# Patient Record
Sex: Female | Born: 1995 | Race: White | Hispanic: No | Marital: Single | Smoking: Former smoker
Health system: Southern US, Community
[De-identification: ages and names within clinical notes are randomized; demographics above are authoritative.]

## PROBLEM LIST (undated history)

## (undated) DIAGNOSIS — F419 Anxiety disorder, unspecified: Secondary | ICD-10-CM

## (undated) HISTORY — DX: Anxiety disorder, unspecified: F41.9

---
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
## 2013-01-13 ENCOUNTER — Ambulatory Visit (INDEPENDENT_AMBULATORY_CARE_PROVIDER_SITE_OTHER): Payer: BC Managed Care – PPO | Admitting: Marriage and Family Therapist

## 2013-01-13 DIAGNOSIS — F411 Generalized anxiety disorder: Secondary | ICD-10-CM

## 2013-01-13 DIAGNOSIS — F331 Major depressive disorder, recurrent, moderate: Secondary | ICD-10-CM

## 2013-01-13 NOTE — Progress Notes (Signed)
   THERAPIST PROGRESS NOTE  Session Time:   Noon - 1 p.m.  Participation Level: Active  Behavioral Response: CasualAlertDepressed (mild)  Type of Therapy: Individual Therapy  Treatment Goals addressed: Coping  Interventions: Strength-based and Supportive  Summary: Angel Oneal is a 17 y.o. female who presents with depression and anxiety. She was referred by Dr. Lucianne Muss.   Patient reports she is doing better than she was doing in her last session.  She reports she is finished with this semester in college with the exception of two exams.  She also reported she has three friends from her philosophy class and has been studying with them all as well as studying with her friend from high school.  Patient reports she has "always studied with others.  She reports she is looking forward to having time off and wants "down time" from others.  She reports her mother has told her she needs down time based on how patient has been, although patient did not elaborate on this statement.  She also talked about her birthday coming up and how she is looking forward to her birthday.  Patient did however report she smoked marijuana with her father.  She reports it "was a bonding experience."  Suicidal/Homicidal: NA  Therapist Response:   Assessed for depression and anxiety and patient reports minimal symptoms.  She reports using self-talk to "talk herself out of having a meltdown" which she was able to do for the past two weeks.  Patient was able to identify getting out of school for a month was a factor in the decrease in her symptoms.  Talked about patient developing friendships at school which is a difficult task for her, and has lead to the development of patient's depression i.e., isolation from others.  Plan: Return again in 2 weeks.  Diagnosis: Axis I: Major depressive d/o, mild; GAD    Axis II: Deferred    Eulamae Greenstein, LMFT, CTS 01/13/2013

## 2013-01-26 ENCOUNTER — Ambulatory Visit (INDEPENDENT_AMBULATORY_CARE_PROVIDER_SITE_OTHER): Payer: BC Managed Care – PPO | Admitting: Marriage and Family Therapist

## 2013-01-26 DIAGNOSIS — F331 Major depressive disorder, recurrent, moderate: Secondary | ICD-10-CM

## 2013-01-26 DIAGNOSIS — F411 Generalized anxiety disorder: Secondary | ICD-10-CM

## 2013-01-26 NOTE — Progress Notes (Signed)
   THERAPIST PROGRESS NOTE  Session Time:  Noon - 1 p.m.  Participation Level: Active  Behavioral Response: CasualAlertDepression (mild)  Type of Therapy: Individual Therapy  Treatment Goals addressed: Coping  Interventions: Strength-based and Supportive  Summary: Angel Oneal is a 17 y.o. female who presents with depression, anxiety.  She was referred by Dr. Lucianne Muss.  Patient states she has been doing well because she is out of school.  She did report "missing the academics" and that she did very well with the exception of receiving a "C" in biology.  Patient states she has been knitting making Christmas presents and actually sold some on [employer].  She reports knitting is "very therapeutic."  She did say she has not taken her antidepressant for about two weeks, but took herself off of it by weening.  She says she feels "good."  She did say she is smoking marijuana but is "trying to be aware that she is not smoking more because she is off of her antidepressant."  Patient states she has not seen Dr. Lucianne Muss but believes she has an appointment coming up with her.  Suicidal/Homicidal: NA  Therapist Response:  Talked about patient taking herself off of her antidepressant.  Discussed any possible problems and reminded her that when she starts school again she could have a problem with depression without her medication.  Talked about patient "needing to try to go without them."  Patient states it is because she wants to use her skills she learned in therapy rather than the medication.  Assessed patient's use of marijuana to help her recognize that she may or should be careful regarding using marijuana for self-medicating.  Patient said she would.  Reviewed patient's treatment plan.  Patient talked about where she is doing well.  Patient did say her self-esteem has improved compared to when she started therapy however she still struggles with feeling good about herself.  She rated how bad her self-esteem  was when she started therapy a "10" on a 0-10 scale but reports it has been "about a 6-7 and times lower."  She admits her ability to open up to others still is a problem but has "improved significantly."  She reports using self-talk and identifying cognitive distortions and she regularly uses meditation to help with depression and anxiety.  Will continue with treatment plan goals to enhance what patient has already learned.  Plan: Return again in 2 weeks.  Diagnosis: Axis I: Major depressive d/o, moderate; GAD    Axis II: Deferred    Klinton Candelas, LMFT, CTS 01/26/2013

## 2013-01-27 ENCOUNTER — Ambulatory Visit (HOSPITAL_COMMUNITY): Payer: Self-pay | Admitting: Marriage and Family Therapist

## 2013-02-10 ENCOUNTER — Ambulatory Visit (HOSPITAL_COMMUNITY): Payer: Self-pay | Admitting: Marriage and Family Therapist

## 2013-03-03 ENCOUNTER — Ambulatory Visit (INDEPENDENT_AMBULATORY_CARE_PROVIDER_SITE_OTHER): Payer: BC Managed Care – PPO | Admitting: Marriage and Family Therapist

## 2013-03-03 DIAGNOSIS — F411 Generalized anxiety disorder: Secondary | ICD-10-CM

## 2013-03-03 DIAGNOSIS — F331 Major depressive disorder, recurrent, moderate: Secondary | ICD-10-CM

## 2013-03-03 NOTE — Progress Notes (Signed)
   THERAPIST PROGRESS NOTE  Session Time:  Noon - 1 p.m.  Participation Level: Active  Behavioral Response: CasualAlertDepressed  Type of Therapy: Individual Therapy  Treatment Goals addressed: Coping  Interventions: Strength-based and Supportive  Summary: Angel Oneal is a 18 y.o. female who presents with depression and anxiety.  She was referred by Dr. Lucianne MussKumar.   Patient reports she has been having trouble figuring out how she is feeling.  She states believing she is "not feeling much of anything."  Patient states she has been in her room a lot which may have to do with it e.g., not having much contact with others.  She reports "taking herself off of her depression medication" and maybe this is why she is feeling "flat" (her word).  Patient talked about how school is going and that she still has no major.  Overall, she states "not much has been going on."  Suicidal/Homicidal: No  Therapist Response: Assessed patient for depression.  Patient appears to have some mild depression based on not having much direction (discussed with patient and she agreed).  Discussed patient wanting to be a pre-med major. She reports wanting to be a doctor ever since "[employer] has been on, particularly seeing one doctor as her hero.  Discussed and processed with patient why she has not moved forward to claim this major and patient states she is worried she will disappoint someone because she could not be available to them (e.g., friends) if she is so busy.  Asked patient to think into the future 20 years from now and identify whether or not she would have regret over not becoming a doctor and patient stated, "yes, I hadn't thought of that."  Discussed patient allowing herself to be "uncomfortable" relating to deciding not to be a doctor for other and allowing herself to go through with what she needs to do to make that happen.  Will continue to discuss same since this topic has to do with patient's  self-esteem problems.  Plan: Return again in 2 weeks.  Diagnosis: Axis I: Major depressive d/o, moderate; GAD    Axis II: Deferred    Caria Transue, LMFT, CTS 03/03/2013

## 2013-03-17 ENCOUNTER — Ambulatory Visit (INDEPENDENT_AMBULATORY_CARE_PROVIDER_SITE_OTHER): Payer: BC Managed Care – PPO | Admitting: Marriage and Family Therapist

## 2013-03-17 DIAGNOSIS — F331 Major depressive disorder, recurrent, moderate: Secondary | ICD-10-CM

## 2013-03-17 DIAGNOSIS — F411 Generalized anxiety disorder: Secondary | ICD-10-CM

## 2013-03-17 NOTE — Progress Notes (Signed)
   THERAPIST PROGRESS NOTE  Session Time:   Noon - 1 p.m.  Participation Level: Active  Behavioral Response: CasualAlertAnxious (mild)  Type of Therapy: Individual Therapy  Treatment Goals addressed: Coping  Interventions: Strength-based and Supportive  Summary: Angel Oneal is a 18 y.o. female who presents with depression and anxiety.  She was referred by Dr. Lucianne MussKumar.   Patient states she has been doing very well this past two weeks.  She stated she has been busy because she decided to declare a pre-med major.  She states she has already set up this major, is taking a chemistry class in the Summer, and signed up for a phlebotomy class.  She also states she has been thinking about her relationship with her boyfriend "not providing what I need" and talked to him about how she is feeling.  She reports he has dropped out of school, is working as a Public affairs consultantdishwasher, and is "smoking marijuana all the time."  Patient states she thinks about him living this sort of life while she goes on with her life.  Suicidal/Homicidal: NA  Therapist Response:   Assessed patient's symptoms of depression and anxiety.  No depression reported and her anxiety is focused on her decisions she has made over the past week coinciding with fear and feeling excited about her future.  Talked about these feeling being normal because she has made a big decision about college.  Discussed at length how to express her needs in relationships.  Plan: Return again in 2 weeks.  Diagnosis: Axis I: Major depressive d/o, moderate; GAD    Axis II: Deferred    Mercy Leppla, LMFT, CTS 03/17/2013

## 2013-03-31 ENCOUNTER — Ambulatory Visit (HOSPITAL_COMMUNITY): Payer: Self-pay | Admitting: Marriage and Family Therapist

## 2013-04-02 ENCOUNTER — Ambulatory Visit (INDEPENDENT_AMBULATORY_CARE_PROVIDER_SITE_OTHER): Payer: BC Managed Care – PPO | Admitting: Marriage and Family Therapist

## 2013-04-02 DIAGNOSIS — F330 Major depressive disorder, recurrent, mild: Secondary | ICD-10-CM

## 2013-04-02 DIAGNOSIS — F411 Generalized anxiety disorder: Secondary | ICD-10-CM

## 2013-04-02 NOTE — Progress Notes (Signed)
   THERAPIST PROGRESS NOTE  Session Time:  Noon - 1 p.m.  Participation Level: Active  Behavioral Response: CasualAlertDepression (mild)  Type of Therapy: Individual Therapy  Treatment Goals addressed: Coping  Interventions: Strength-based and Supportive/systems theory  Summary: Angel Oneal is a 18 y.o. female who presents with depression and anxiety.  She was referred by Dr. Dwyane Dee.   Patient reports she has been feeling a "let down" or boredom for the past two weeks.  She reports she does not know why.  She called it "an out of body experience where I feel disconnected from everything."  Patient talked about her relationship with her boyfriend relating the talk she had with him three weeks ago about "expecting and wanting more out of the relationship."  She reports he has been texting her every day as requested but that she has been disappointed that the relationship is not moving forward.  Patient states she "wants to go out to dinner other than [employer]," and "when he says he will make things up to me like missing Valentine's Day or my birthday I want him to follow through."    Suicidal/Homicidal: No  Therapist Response:  Evaluated patient's depression and patient does appear to be depressed in a low-level way, seeming like boredom based on self-report.  Discussed same and with discussion she was able to identify the week before she worked with her advisor to establish a premed major and decided to talk to her boyfriend which may have lead to a letdown after such a positive week.  Talked about ways patient can "jumpstart" herself to alleviate some of what she is feeling such as going shopping with her friend and/or redecorating her bedroom.  Also, discussed with patient how she and her boyfriend may be systemically interacting in a way that is causing her to be disappointed.  Talked about "replaying old tapes" on both sides.  Patient was able to recognize she has a tendency to "take  responsibility for making others happy," something she admitted is a problem for her in other sessions.  She was able to identify that she "no longer wants to feel responsible in this way but does not know how to stop.  Suggested she talk to her mother who may have some information on how patient took on this belief.  Also used strength-based therapy to point out to patient that two weeks ago she decided to confront her boyfriend about same, not wanting to "be responsible for her own happiness."  Will continue this discussion since this belief is one that leads to her not getting her needs met and ultimately some of her depression.  Plan: Return again in 1 weeks.  Diagnosis: Axis I: Major depressive d/o, mile; GAD    Axis II: Deferred    Farran Amsden, LMFT, CTS 04/02/2013

## 2013-04-14 ENCOUNTER — Ambulatory Visit (HOSPITAL_COMMUNITY): Payer: Self-pay | Admitting: Marriage and Family Therapist

## 2013-04-28 ENCOUNTER — Ambulatory Visit (HOSPITAL_COMMUNITY): Payer: Self-pay | Admitting: Marriage and Family Therapist

## 2013-05-04 ENCOUNTER — Ambulatory Visit (INDEPENDENT_AMBULATORY_CARE_PROVIDER_SITE_OTHER): Payer: BC Managed Care – PPO | Admitting: Marriage and Family Therapist

## 2013-05-04 DIAGNOSIS — F330 Major depressive disorder, recurrent, mild: Secondary | ICD-10-CM

## 2013-05-04 DIAGNOSIS — F411 Generalized anxiety disorder: Secondary | ICD-10-CM

## 2013-05-04 NOTE — Progress Notes (Signed)
   THERAPIST PROGRESS NOTE  Session Time:  Noon - 1 p.m.  Participation Level: Active  Behavioral Response: CasualAlertAnxious (mild)  Type of Therapy: Individual Therapy  Treatment Goals addressed: Coping  Interventions: Strength-based and Supportive  Summary: Lynelle DoctorKatelyn M Berryhill is a 18 y.o. female who presents with depression and anxiety.  She was referred by Dr. Lucianne MussKumar.  Patient reports she has had some anxiety but has not been able to figure out why. She reports everything is going well.  Patient states her big news is that she will be moving out of her house with mom to get an apartment near college.  She reports not having anyone to be a roommate at this time.  She also reports she is working hard in school and at this point this is her focus - to get her grades up to be good enough for medical school.  She talked about some of the extra-curricular activities she wants to join.   Suicidal/Homicidal: Negative  Therapist Response:   Assessed patient's depression and anxiety.  She reports no depression.  Discussed ideas on patient having anxiety and she was able to come up with patient's body reacting th positive stress (getting an apartment) versus looking at anxiety as a negative.  Patient was late for her appointment and was only able to do a 30 minute sessin.  Plan: Return again in 2 weeks.  Diagnosis: Axis I: Major depressive d/o, moderate; GAD    Axis II: Deferred    Lavonta Tillis, LMFT, CTS 05/04/2013

## 2013-05-14 ENCOUNTER — Ambulatory Visit (INDEPENDENT_AMBULATORY_CARE_PROVIDER_SITE_OTHER): Payer: BC Managed Care – PPO | Admitting: Marriage and Family Therapist

## 2013-05-14 DIAGNOSIS — F330 Major depressive disorder, recurrent, mild: Secondary | ICD-10-CM

## 2013-05-14 DIAGNOSIS — F411 Generalized anxiety disorder: Secondary | ICD-10-CM

## 2013-05-14 NOTE — Progress Notes (Signed)
   THERAPIST PROGRESS NOTE  Session Time:  Noon - 1 p.m.  Participation Level: Active  Behavioral Response: CasualAlertDepressed  Type of Therapy: Individual Therapy  Treatment Goals addressed:  Coping  Interventions: Strength-based, Supportive and Other: Couples therapy  Summary: Angel Oneal is a 18 y.o. female Caucasian who presents with depression and anxiety.  She was referred by Dr. Lucianne MussKumar.   Patient states she has been feeling more depressed.  She described it as "a wave of depression overwhelms me."  She reports not knowing why.  She did say her mother did not get the information in to her college on time to get dorm housing. Patient states she wants to get into a dorm because she "needs to make other friends" and does not think she could do this as well if she lives home with mom.  She also talked about her boyfriend being depressed, that she has, every week, talked to him about the relationship not moving forward, that they do the same things week after week e.g., not going out, that he is depressed and she does not know how to help him.  She talked about her mother being married three times, and how her mother stayed with the men when she should have left them, and is concerned this will happen to her.  She reports she has been busy with school and friends, mostly school.  Suicidal/Homicidal: NA  Therapist Response:  Discussed patient identifying some of the causes of her depression.  Through processing she was able to identify the possibility that she will have to stay home and the problems she is having with her boyfriend.  Discussed their situation from a systemic couple standpoint, to help patient identify the cycle they are in and the cycle patient is in "trying to fix the boyfriend."  Discussed patient learning to step away from the situation since the admits she sends the BF long texts each week trying to uplift or help him, texts she reports he does not respond.  Talked  about suggesting the help once, letting it go, but also, coming up with her bottom line, not necessarily the treat of breaking up with him.  Her task is to come up with a more reasonable bottom line, to help patient establish boundaries around her urge to help him, thereby helping her learn how to "not do what her mother has done."  At the end of the session patient stated she "felt a lot better having a tangible plan" relating to boyfriend interactions.  Plan: Return again in 2 weeks.  Diagnosis: Axis I: Major depressive d/o, moderate; GAD    Axis II: Deferred    [name], LMFT, CTS 05/14/2013

## 2013-05-26 ENCOUNTER — Ambulatory Visit (INDEPENDENT_AMBULATORY_CARE_PROVIDER_SITE_OTHER): Payer: BC Managed Care – PPO | Admitting: Marriage and Family Therapist

## 2013-05-26 DIAGNOSIS — F411 Generalized anxiety disorder: Secondary | ICD-10-CM

## 2013-05-26 DIAGNOSIS — F331 Major depressive disorder, recurrent, moderate: Secondary | ICD-10-CM

## 2013-05-26 NOTE — Progress Notes (Signed)
   THERAPIST PROGRESS NOTE  Session Time:  Noon - 1 p.m.  Participation Level: Active  Behavioral Response: CasualDrowsyMalaise  Type of Therapy: Individual Therapy  Treatment Goals addressed: Coping  Interventions: Strength-based and Supportive  Summary: Angel DoctorKatelyn M Oneal is a 18 y.o. female who presents with depression and anxiety.  She was referred by Dr. Lucianne MussKumar.  Patient reports she has been feeling a sort of "malaise" (her word).  She states she does not know why except that she is going to school and coming home, not doing much to have fun.  She reports talking to her boyfriend about getting a therapist but stated he said no.  She also reports her friend's parents are going through a lot of "acting out behavior" which has cause great stress for her friend and patient is the only one she talks with about what is happening at home.  Patient stated she was up trying to get classes for next semester and did not get enough sleep and was tired at the time of the session.  She did talk about her plans for school which do not include patient living on campus.  Suicidal/Homicidal: Negative  Therapist Response:  Today patient seemed to have difficulty focusing on topic and agreed to same.  Admitted she was tired and having difficulty focusing and not knowing what was wrong.  She did say she has not been doing much re self-care and having fun and "I really need to do something."  Talked about her going to a festival with her father this weekend and made it a directive.  Plan: Return again in 2 weeks.  Diagnosis: Axis I: Major depressive d/o, moderate; GAD    Axis II: Deferred    Lux Skilton, LMFT, CTS 05/26/2013

## 2013-06-09 ENCOUNTER — Ambulatory Visit (INDEPENDENT_AMBULATORY_CARE_PROVIDER_SITE_OTHER): Payer: BC Managed Care – PPO | Admitting: Marriage and Family Therapist

## 2013-06-09 NOTE — Progress Notes (Unsigned)
   THERAPIST PROGRESS NOTE  Session Time:  Noon - 1 p.m.  Participation Level: {BHH PARTICIPATION LEVEL:22264}  Behavioral Response: {Appearance:22683}{BHH LEVEL OF CONSCIOUSNESS:22305}{BHH MOOD:22306}  Type of Therapy: Individual Therapy  Treatment Goals addressed: Coping  Interventions: {CHL AMB BH Type of Intervention:21022753}  Summary: Angel Oneal is a 18 y.o. female Caucasian who presents with depression and anxiety.   She was referred by Dr. Lucianne MussKumar.    Suicidal/Homicidal: {BHH YES OR NO:22294}{yes/no/with/without intent/plan:22693}  Therapist Response: ***  Plan: Return again in 3 weeks.  Diagnosis: Axis I: {psych axis 1:31909}    Axis II: Deferred    Sweetie Giebler, LMFT, CTS 06/09/2013

## 2013-07-07 ENCOUNTER — Telehealth (HOSPITAL_COMMUNITY): Payer: Self-pay | Admitting: Marriage and Family Therapist

## 2013-07-07 ENCOUNTER — Ambulatory Visit (HOSPITAL_COMMUNITY): Payer: Self-pay | Admitting: Marriage and Family Therapist

## 2013-07-07 NOTE — Telephone Encounter (Signed)
Patient did not show for her appointment.  Called and left message.  Cleophas Dunker, LMFT, CTS Counselor

## 2013-07-07 NOTE — Progress Notes (Unsigned)
   THERAPIST PROGRESS NOTE  Session Time:  Noon - 1 p.m.  Participation Level: Did Not Attend  Behavioral Response:   Type of Therapy: Individual Therapy  Treatment Goals addressed: Coping  Interventions:   Summary: Angel Oneal is a 18 y.o. female who presents with depression and anxiety.  She was referred by Dr. Lucianne Muss.  PATIENT DID NOT SHOW FOR HER APPOINTMENT.  Suicidal/Homicidal:   Therapist Response:   CALLED AND LEFT MESSAGE FOR PATIENT.  Plan: Return again in 2 weeks.  Diagnosis: Axis I: Major depressive d/o, mild; GAD    Axis II: Deferred    Angel Larue, LMFT, CTS 07/07/2013

## 2013-07-09 ENCOUNTER — Ambulatory Visit (INDEPENDENT_AMBULATORY_CARE_PROVIDER_SITE_OTHER): Payer: BC Managed Care – PPO | Admitting: Marriage and Family Therapist

## 2013-07-09 DIAGNOSIS — F330 Major depressive disorder, recurrent, mild: Secondary | ICD-10-CM

## 2013-07-09 DIAGNOSIS — F411 Generalized anxiety disorder: Secondary | ICD-10-CM

## 2013-07-09 NOTE — Progress Notes (Signed)
   THERAPIST PROGRESS NOTE  Session Time:  2:00 - 3:00 p.m.  Participation Level: Active  Behavioral Response: CasualAlertSad  Type of Therapy: Individual Therapy  Treatment Goals addressed: Coping  Interventions: Strength-based and Supportive  Summary: Angel Oneal is a 18 y.o. female Caucasian who presents with depression and anxiety.  She was referred by Dr. Lucianne Muss.  Patient came in reporting she was sad because this Clinical research associate was retiring.  She reported overall she was doing well, that her relationship with her boyfriend has gotten better not because he is changing, but because she her expectations were more realistic.  She talked about how she is doing in school taking a summer class.  She talked about taking a break for a month after the class.  Patient also admitted she has not taken her anti-depressant "for a long time" and has not seen Dr. Lucianne Muss for a long time as well.  She reports believing she does not need medication at this time.  Suicidal/Homicidal: NA  Therapist Response:   Discussed termination with this patient, processed with patient, and discussed referrals.  Discussed how patient is doing well.  Will see patient once more before this IT trainer.  Plan: Return again in 1 weeks.  Diagnosis: Axis I: Major depressive d/o, mild; GAD    Axis II: Deferred    Prisca Gearing, LMFT, CTS 07/09/2013

## 2013-07-21 ENCOUNTER — Ambulatory Visit (INDEPENDENT_AMBULATORY_CARE_PROVIDER_SITE_OTHER): Payer: BC Managed Care – PPO | Admitting: Marriage and Family Therapist

## 2013-07-21 DIAGNOSIS — F411 Generalized anxiety disorder: Secondary | ICD-10-CM

## 2013-07-21 DIAGNOSIS — F331 Major depressive disorder, recurrent, moderate: Secondary | ICD-10-CM

## 2013-07-21 NOTE — Progress Notes (Signed)
   THERAPIST PROGRESS NOTE  Session Time:  3:00 - 4:00 p.m.  Participation Level: Active  Behavioral Response: CasualAlertDepressed (mild)  Type of Therapy: Individual Therapy  Treatment Goals addressed: Coping  Interventions: Strength-based and Supportive/Termination  Summary: LANAYSHA ORGAN is a 18 y.o. femaleCaucasian who presents with depression and anxiety.  She was referred by Dr. Lucianne Muss.   Patient reports she has had some depression.  She reports some of it has to go with her relationship with her boyfriend but she is "trying to learn to detach" when he "withdraws."  She also reports most of her focus is on her chemistry class which she has been having a difficult time getting an "A."  She discussed in depth how this class has been stressful.  Suicidal/Homicidal: NA  Therapist Response:   Assessed symptoms of anxiety and depression.  Patient has been experiencing situational depression and discussed same along with identifying skills she already has to cope with same.  Spent most of the session terminating.  Processed patient's experience, reviewed referrals.  Recommended [facility] and [facility].  Plan: Return again in 0 weeks.  Diagnosis: Axis I: Major depressive d/o, moderate; GAD    Axis II: Deferred    Lamari Beckles, LMFT, CTS 07/21/2013

## 2014-03-25 ENCOUNTER — Emergency Department (HOSPITAL_COMMUNITY): Payer: 59

## 2014-03-25 ENCOUNTER — Encounter (HOSPITAL_COMMUNITY): Payer: Self-pay | Admitting: Emergency Medicine

## 2014-03-25 ENCOUNTER — Emergency Department (HOSPITAL_COMMUNITY)
Admission: EM | Admit: 2014-03-25 | Discharge: 2014-03-25 | Disposition: A | Discharge status: Home / Self Care | Payer: 59 | Attending: Emergency Medicine | Admitting: Emergency Medicine

## 2014-03-25 DIAGNOSIS — Y998 Other external cause status: Secondary | ICD-10-CM | POA: Diagnosis not present

## 2014-03-25 DIAGNOSIS — Z79899 Other long term (current) drug therapy: Secondary | ICD-10-CM | POA: Diagnosis not present

## 2014-03-25 DIAGNOSIS — Y9239 Other specified sports and athletic area as the place of occurrence of the external cause: Secondary | ICD-10-CM | POA: Insufficient documentation

## 2014-03-25 DIAGNOSIS — Z720 Tobacco use: Secondary | ICD-10-CM | POA: Insufficient documentation

## 2014-03-25 DIAGNOSIS — W208XXA Other cause of strike by thrown, projected or falling object, initial encounter: Secondary | ICD-10-CM | POA: Insufficient documentation

## 2014-03-25 DIAGNOSIS — Y9389 Activity, other specified: Secondary | ICD-10-CM | POA: Insufficient documentation

## 2014-03-25 DIAGNOSIS — S9032XA Contusion of left foot, initial encounter: Secondary | ICD-10-CM | POA: Insufficient documentation

## 2014-03-25 DIAGNOSIS — J45909 Unspecified asthma, uncomplicated: Secondary | ICD-10-CM | POA: Diagnosis not present

## 2014-03-25 DIAGNOSIS — M79672 Pain in left foot: Secondary | ICD-10-CM

## 2014-03-25 DIAGNOSIS — S99922A Unspecified injury of left foot, initial encounter: Secondary | ICD-10-CM | POA: Diagnosis present

## 2014-03-25 MED ORDER — IBUPROFEN 800 MG PO TABS: 800.0000 mg | ORAL_TABLET | Freq: Three times a day (TID) | ORAL | Status: AC

## 2014-03-25 NOTE — ED Provider Notes (Signed)
CSN: [id number]     Arrival date & time 03/25/14  1151 History  This chart was scribed for non-physician practitioner, Ladona Mow, PA-C, working with Elwin Mocha, MD by Milly Jakob, ED Scribe. The patient was seen in room TR06C/TR06C. Patient's care was started at 12:39 PM.   Chief Complaint  Patient presents with  . Foot Pain   The history is provided by the patient. No language interpreter was used.   HPI Comments: Angel Oneal is a 19 y.o. female who presents to the Emergency Department complaining of constant, throbbing, left foot pain after dropping a 45 lb plate on it at the gym earlier today. She states that the pain is exacerbated by palpation. She reports associated swelling. She denies any allergies.  She denies numbness, weakness.   Past Medical History  Diagnosis Date  . Asthma    Past Surgical History  Procedure Laterality Date  . Appendectomy  2007   Family History  Problem Relation Age of Onset  . Suicidality Cousin   . Depression Cousin   . Bipolar disorder Cousin    History  Substance Use Topics  . Smoking status: Current Some Day Smoker -- 0.25 packs/day for 1 years    Types: Cigarettes  . Smokeless tobacco: Never Used  . Alcohol Use: No   OB History    No data available     Review of Systems  Constitutional: Negative for fever and chills.  Musculoskeletal: Positive for arthralgias (left foot).  Skin: Negative for wound.  Neurological: Negative for weakness and numbness.    Allergies  Review of patient's allergies indicates no known allergies.  Home Medications   Prior to Admission medications   Medication Sig Start Date End Date Taking? Authorizing Provider  albuterol (PROVENTIL HFA;VENTOLIN HFA) 108 (90 BASE) MCG/ACT inhaler Inhale 2 puffs into the lungs every 6 (six) hours as needed.    Historical Provider, MD  buPROPion (WELLBUTRIN XL) 150 MG 24 hr tablet Take 1 tablet (150 mg total) by mouth every morning. 09/23/12 09/23/13  Nelly Rout, MD  etonogestrel-ethinyl estradiol (NUVARING) 0.12-0.015 MG/24HR vaginal ring Place 1 each vaginally every 28 (twenty-eight) days. Insert vaginally and leave in place for 3 consecutive weeks, then remove for 1 week.    Historical Provider, MD  hydrOXYzine (VISTARIL) 25 MG capsule Take 1 capsule (25 mg total) by mouth 3 (three) times daily as needed for anxiety. 09/23/12   Nelly Rout, MD  ibuprofen (ADVIL,MOTRIN) 800 MG tablet Take 1 tablet (800 mg total) by mouth 3 (three) times daily. 03/25/14   Monte Fantasia, PA-C  Naproxen Sodium (ALEVE PO) Take by mouth.    Historical Provider, MD   Triage Vitals: BP 130/84 mmHg  Pulse 86  Temp(Src) 99 F (37.2 C) (Oral)  Resp 22  SpO2 99%  LMP 02/22/2014 Physical Exam  Constitutional: She is oriented to person, place, and time. She appears well-developed and well-nourished. No distress.  HENT:  Head: Normocephalic and atraumatic.  Eyes: Conjunctivae and EOM are normal.  Neck: Neck supple. No tracheal deviation present.  Cardiovascular: Normal rate.   Pulmonary/Chest: Effort normal. No respiratory distress.  Musculoskeletal: Normal range of motion.  Left foot exam: No obvious erythema, edema, warmth, swelling, deformity noted. Tenderness to the medial aspect of her 1st metatarsal and MTP joint. Distal sensation intact. Cap refill < 2 seconds. DP pulses 2+.  Full range of motion of knee, ankle, toes. Motor strength 5 out of 5 at knee, ankle.  Neurological: She  is alert and oriented to person, place, and time.  Skin: Skin is warm and dry.  Psychiatric: She has a normal mood and affect. Her behavior is normal.  Nursing note and vitals reviewed.   ED Course  Procedures (including critical care time) DIAGNOSTIC STUDIES: Oxygen Saturation is 99% on room air, nomal by my interpretation.    COORDINATION OF CARE: 12:43 PM-Discussed treatment plan which includes left foot X-ray with pt at bedside and pt agreed to plan.   Labs Review Labs  Reviewed - No data to display  Imaging Review Dg Foot Complete Left  03/25/2014   CLINICAL DATA:  Pt reports that she was at the gym and dropped a 45 pound weight on her left foot. Pt has mild swelling/pain to the medial aspect of left foot near her big toe.  EXAM: LEFT FOOT - COMPLETE 3+ VIEW  COMPARISON:  None.  FINDINGS: There is no evidence of fracture or dislocation. There is no evidence of arthropathy or other focal bone abnormality. Soft tissues are unremarkable.  IMPRESSION: Negative.   Electronically Signed   By: Amie Portlandavid  Ormond M.D.   On: 03/25/2014 13:45     EKG Interpretation None      MDM   Final diagnoses:  Foot pain, left  Contusion, foot, left, initial encounter    Patient here with left foot pain after dropping a weight on her foot the gym. Radiographs obtained were unremarkable for any acute pathology or osseous injury. Patient does not have any significant swelling or deformity or laxity of her foot. Patient neurovascularly intact. RICE therapy recommended and discussed with patient. I encouraged patient to follow with orthopedics should her symptoms persist or worsen. I discussed return precautions with patient, and patient verbalizes understanding and agreement of this plan. I encouraged patient to call or return to the ER should she have any questions or concerns.  I personally performed the services described in this documentation, which was scribed in my presence. The recorded information has been reviewed and is accurate.  BP 130/84 mmHg  Pulse 86  Temp(Src) 99 F (37.2 C) (Oral)  Resp 22  SpO2 99%  LMP 02/22/2014  Signed,  Ladona MowJoe Rembert Browe, PA-C 2:05 PM   Monte FantasiaJoseph W Tonesha Tsou, PA-C 03/25/14 1405  Elwin MochaBlair Walden, MD 03/26/14 [id number]

## 2014-03-25 NOTE — ED Notes (Signed)
Pt reports that she was at the gym and dropped a 45 pound weight on her left foot. Pt has mild swelling to left foot. NAD at this time.

## 2014-03-25 NOTE — Discharge Instructions (Signed)
Contusion °A contusion is a deep bruise. Contusions are the result of an injury that caused bleeding under the skin. The contusion may turn blue, purple, or yellow. Minor injuries will give you a painless contusion, but more severe contusions may stay painful and swollen for a few weeks.  °CAUSES  °A contusion is usually caused by a blow, trauma, or direct force to an area of the body. °SYMPTOMS  °· Swelling and redness of the injured area. °· Bruising of the injured area. °· Tenderness and soreness of the injured area. °· Pain. °DIAGNOSIS  °The diagnosis can be made by taking a history and physical exam. An X-ray, CT scan, or MRI may be needed to determine if there were any associated injuries, such as fractures. °TREATMENT  °Specific treatment will depend on what area of the body was injured. In general, the best treatment for a contusion is resting, icing, elevating, and applying cold compresses to the injured area. Over-the-counter medicines may also be recommended for pain control. Ask your caregiver what the best treatment is for your contusion. °HOME CARE INSTRUCTIONS  °· Put ice on the injured area. °¨ Put ice in a plastic bag. °¨ Place a towel between your skin and the bag. °¨ Leave the ice on for 15-20 minutes, 3-4 times a day, or as directed by your health care provider. °· Only take over-the-counter or prescription medicines for pain, discomfort, or fever as directed by your caregiver. Your caregiver may recommend avoiding anti-inflammatory medicines (aspirin, ibuprofen, and naproxen) for 48 hours because these medicines may increase bruising. °· Rest the injured area. °· If possible, elevate the injured area to reduce swelling. °SEEK IMMEDIATE MEDICAL CARE IF:  °· You have increased bruising or swelling. °· You have pain that is getting worse. °· Your swelling or pain is not relieved with medicines. °MAKE SURE YOU:  °· Understand these instructions. °· Will watch your condition. °· Will get help right  away if you are not doing well or get worse. °Document Released: 11/08/2004 Document Revised: 02/03/2013 Document Reviewed: 12/04/2010 °[employer]® Patient Information ©2015 [employer]. This information is not intended to replace advice given to you by your health care provider. Make sure you discuss any questions you have with your health care provider. ° °

## 2015-08-02 ENCOUNTER — Ambulatory Visit (INDEPENDENT_AMBULATORY_CARE_PROVIDER_SITE_OTHER): Payer: 59 | Admitting: Family Medicine

## 2015-08-02 ENCOUNTER — Encounter: Payer: Self-pay | Admitting: Family Medicine

## 2015-08-02 VITALS — BP 120/72 | HR 90 | Temp 98.1°F | Ht 67.75 in | Wt 135.2 lb

## 2015-08-02 DIAGNOSIS — F321 Major depressive disorder, single episode, moderate: Secondary | ICD-10-CM | POA: Diagnosis not present

## 2015-08-02 DIAGNOSIS — F4323 Adjustment disorder with mixed anxiety and depressed mood: Secondary | ICD-10-CM | POA: Diagnosis not present

## 2015-08-02 DIAGNOSIS — Z23 Encounter for immunization: Secondary | ICD-10-CM | POA: Diagnosis not present

## 2015-08-02 DIAGNOSIS — F410 Panic disorder [episodic paroxysmal anxiety] without agoraphobia: Secondary | ICD-10-CM | POA: Insufficient documentation

## 2015-08-02 DIAGNOSIS — F509 Eating disorder, unspecified: Secondary | ICD-10-CM

## 2015-08-02 DIAGNOSIS — J302 Other seasonal allergic rhinitis: Secondary | ICD-10-CM

## 2015-08-02 DIAGNOSIS — J989 Respiratory disorder, unspecified: Secondary | ICD-10-CM

## 2015-08-02 DIAGNOSIS — J45909 Unspecified asthma, uncomplicated: Secondary | ICD-10-CM

## 2015-08-02 DIAGNOSIS — R0989 Other specified symptoms and signs involving the circulatory and respiratory systems: Secondary | ICD-10-CM

## 2015-08-02 MED ORDER — ALBUTEROL SULFATE HFA 108 (90 BASE) MCG/ACT IN AERS: 2.0000 | INHALATION_SPRAY | Freq: Four times a day (QID) | RESPIRATORY_TRACT | Status: AC | PRN

## 2015-08-02 NOTE — Assessment & Plan Note (Signed)
Rarely ever uses pro-air but would like RF just so she has it, just in case

## 2015-08-02 NOTE — Assessment & Plan Note (Signed)
rec allegra D otc +/- flonase and others.  Routine counseling done.    -Discussed with patient seasonal allergies and typical preventative strategies- i.e. N95 mask etc  -Recommended nasal rinses\ washes twice a day as well as after any prolonged exposure to outdoor environmental factors.  -If continues to worsen despite preventative strategies, take over-the-counter Allegra daily during allergy seasons.  We can consider Flonase, Rhinocort and the like if symptoms not well controlled with just oral tablet, nasal rinses and preventative strategies

## 2015-08-02 NOTE — Assessment & Plan Note (Signed)
Pt denies current depressive sx but appears upset today

## 2015-08-02 NOTE — Assessment & Plan Note (Signed)
Denies current issue. 

## 2015-08-02 NOTE — Assessment & Plan Note (Signed)
Pt has mechanisms to help deal with her sx

## 2015-08-02 NOTE — Assessment & Plan Note (Addendum)
Pt showing some seeking behaviors and unwilling to try/entertain any other meds excpt "Xanax".   I mentioned if we did entertain giving her short term use xanax I would need to drug test her and initially she declined any illicit use to me, but then said she would test pos for THC.    I am not comfortable giving her chronic Xanax as she is requesting so we discussed Psychiatry would be best way for her to be properly treated.   She seems happy with that referral and understands my limitations as a family doc  Strongly rec CBT/ counseling  Guided meditation for detachment from overthinking- Angel Oneal.  Try to use nightly/ during the day as needed for anxiety

## 2015-08-02 NOTE — Progress Notes (Signed)
Angel Oneal, D.O. Primary care at [facility]  Subjective:    CC: New pt, here to establish care.   HPI: Angel Oneal is a pleasant 20 y.o. female who presents to [facility] at [facility] today   H/o seasonal asthma:   Ever since a child.  Mostly spring or fall- allergy season and if she gets a cold/ URI.  Never uses albuterol otherwise and may go months w/o use.  Since she quit smoking Oct '16- hasn't needed nearly as much.  Committed relationship -2 yrs now.  Uses condoms every time.  Overly cautious  advil- occ for HA's or cramps etc- rarely uses it.    Did take wellbutrin in past--> for anxiety/ depression.  ( med records show she took prozac by Dr Lucianne Muss for many mo as well and did well for long while; then changed to Wellbutrin)      Still now occ gets panic attacks but well controlled...Marland KitchenMarland Kitchenhowever, minutes later, upon further questioning, patient became very emotional and crying during office visit.   Pt appears very upset b/c "doctors are always telling me I need to take a med daily for my mood " but she thinks she only needs Xanax.    She claims that Xanax is the only thing that helps with her anxiety, but several doctors, she noted 5, will not give the meds to her.   Only has panic attacks, maybe 1-2 times per mo.    She says she has" lots of friends that take Xanax and they don't even need it and she can't get any."   She is tearful whenever I mention I think chronic Benzos are not best txmnt for her and I mention she may need chronic daily meds.   No SI/HI.   Sleeping well.    She tell sme has some social anxiety, and works a lot on challenging herself- even took a Facilities manager class in college to help her become more comfortable  Has seen Dr Lucianne Muss at [facility] and Geannie Risen MD (off [address])  and doesn't want to see either of them. She won't explain why to me.      Upon further review of pt';s chart:  Last seen by Dr. Lucianne Muss 09/23/12, initially seen on  05/22/11  and has long h/o counseling sessions with Cleophas Dunker, LMFT. and others.    .    Past Medical History  Diagnosis Date  . Asthma   . Anxiety     Past Surgical History  Procedure Laterality Date  . Appendectomy  2007    Family History  Problem Relation Age of Onset  . Suicidality Cousin   . Depression Cousin   . Bipolar disorder Cousin   . Diabetes Mother   . Cancer Mother     breast  . Heart failure Father   . Depression Father   . Healthy Sister   . Alzheimer's disease Maternal Grandmother   . Heart disease Maternal Grandfather   . Heart disease Paternal Grandmother   . Dementia Paternal Grandfather     History  Drug Use  . Yes  . Special: Marijuana  ,  History  Alcohol Use: Not on file  ,  History  Smoking status  . Former Smoker -- 0.25 packs/day for 1 years  . Types: Cigarettes  . Quit date: 11/13/2014  Smokeless tobacco  . Never Used  ,  History  Sexual Activity  . Sexual Activity: Yes  .  Birth Control/ Protection: Implant    Patient's Medications  New Prescriptions   No medications on file  Previous Medications   BUPROPION (WELLBUTRIN XL) 150 MG 24 HR TABLET    Take 1 tablet (150 mg total) by mouth every morning.   ETONOGESTREL-ETHINYL ESTRADIOL (NUVARING) 0.12-0.015 MG/24HR VAGINAL RING    Place 1 each vaginally every 28 (twenty-eight) days. Insert vaginally and leave in place for 3 consecutive weeks, then remove for 1 week.   IBUPROFEN (ADVIL,MOTRIN) 800 MG TABLET    Take 1 tablet (800 mg total) by mouth 3 (three) times daily.  Modified Medications   Modified Medication Previous Medication   ALBUTEROL (PROVENTIL HFA;VENTOLIN HFA) 108 (90 BASE) MCG/ACT INHALER albuterol (PROVENTIL HFA;VENTOLIN HFA) 108 (90 BASE) MCG/ACT inhaler      Inhale 2 puffs into the lungs every 6 (six) hours as needed.    Inhale 2 puffs into the lungs every 6 (six) hours as needed.  Discontinued Medications   HYDROXYZINE (VISTARIL) 25 MG CAPSULE    Take 1  capsule (25 mg total) by mouth 3 (three) times daily as needed for anxiety.   NAPROXEN SODIUM (ALEVE PO)    Take by mouth.    ALLERGIES: Review of patient's allergies indicates no known allergies.  Review of Systems:   ( Completed via her Adult Medical History Intake form today ) General:   Denies fever, chills, appetite changes, unexplained weight loss.  Optho/Auditory:   Denies visual changes, blurred vision/LOV, ringing in ears/ diff hearing Respiratory:   Denies SOB, DOE, + h/o cough/ wheezing but none current.  Cardiovascular:   Denies chest pain, palpitations, new onset peripheral edema  Gastrointestinal:   Denies nausea, vomiting, diarrhea.  Genitourinary:    Denies dysuria, increased frequency, flank pain.  Endocrine:     Denies hot or cold intolerance, polyuria, polydipsia. Musculoskeletal:  Denies unexplained myalgias, joint swelling, arthralgias, gait problems.  Skin:  Denies rash, suspicious lesions or new/ changes in moles Neurological:    Denies dizziness, syncope, unexplained weakness, lightheadedness, numbness  Psychiatric/Behavioral:   Denies mood changes, suicidal or homicidal ideations, hallucinations, + anxiety    Objective:   Blood pressure 132/94, pulse 90, temperature 98.1 F (36.7 C), temperature source Oral, height 5' 7.75" (1.721 m), weight 135 lb 3.2 oz (61.326 kg), last menstrual period 07/23/2015. Body mass index is 20.71 kg/(m^2).  General: Well Developed, well nourished, and in no acute distress.  Neuro: Alert and oriented x3, extra-ocular muscles intact, sensation grossly intact.  HEENT: Normocephalic, atraumatic, pupils equal round reactive to light, neck supple, no gross masses Skin: no gross suspicious lesions or rashes  Cardiac: Regular rate and rhythm, no murmurs rubs or gallops.  Respiratory: Essentially clear to auscultation bilaterally. Not using accessory muscles, speaking in full sentences.  Abdominal: Soft, not grossly  distended Musculoskeletal: Ambulates w/o diff, FROM * 4 ext.  Vasc: less 2 sec cap RF, warm and pink  Psych:  No HI/SI, judgement and insight poor, emotional, crying.     Impression and Recommendations:    The patient was counselled, risk factors were discussed, anticipatory guidance given.  h/o Major depressive disorder, single episode, moderate (HCC) Pt denies current depressive sx but appears upset today  h/o Eating disorder, unspecified Denies current issue.   Adjustment disorder with mixed anxiety and depressed mood Pt showing some seeking behaviors and unwilling to try/entertain any other meds excpt "Xanax".   I mentioned if we did entertain giving her short term use xanax I would need to drug  test her and initially she declined any illicit use to me, but then said she would test pos for THC.    I am not comfortable giving her chronic Xanax as she is requesting so we discussed Psychiatry would be best way for her to be properly treated.   She seems happy with that referral and understands my limitations as a family doc  Strongly rec CBT/ counseling  Guided meditation for detachment from overthinking- Ina Kick.  Try to use nightly/ during the day as needed for anxiety  Panic attacks Pt has mechanisms to help deal with her sx  Seasonal allergies rec allegra D otc +/- flonase and others.  Routine counseling done.    -Discussed with patient seasonal allergies and typical preventative strategies- i.e. N95 mask etc  -Recommended nasal rinses\ washes twice a day as well as after any prolonged exposure to outdoor environmental factors.  -If continues to worsen despite preventative strategies, take over-the-counter Allegra daily during allergy seasons.  We can consider Flonase, Rhinocort and the like if symptoms not well controlled with just oral tablet, nasal rinses and preventative strategies  Reactive airway disease without complication Rarely ever uses pro-air but would  like RF just so she has it, just in case  Gross side effects, risk and benefits, and alternatives of medications discussed with patient.  Patient is aware that all medications have potential side effects and we are unable to predict every sideeffect or drug-drug interaction that may occur.  Expresses verbal understanding and consents to current therapy plan and treatment regiment.  Note: This document was prepared using Dragon voice recognition software and may include unintentional dictation errors.

## 2015-08-02 NOTE — Patient Instructions (Addendum)
Guided meditation for detachment from [place].  Try to use nightly/ during the day as needed for anxiety  - will refer pt to psychiatrist in the area.  (Not [place])      Stress and Stress Management Stress is a normal reaction to life events. It is what you feel when life demands more than you are used to or more than you can handle. Some stress can be useful. For example, the stress reaction can help you catch the last bus of the day, study for a test, or meet a deadline at work. But stress that occurs too often or for too long can cause problems. It can affect your emotional health and interfere with relationships and normal daily activities. Too much stress can weaken your immune system and increase your risk for physical illness. If you already have a medical problem, stress can make it worse. CAUSES  All sorts of life events may cause stress. An event that causes stress for one person may not be stressful for another person. Major life events commonly cause stress. These may be positive or negative. Examples include losing your job, moving into a new home, getting married, having a baby, or losing a loved one. Less obvious life events may also cause stress, especially if they occur day after day or in combination. Examples include working long hours, driving in traffic, caring for children, being in debt, or being in a difficult relationship. SIGNS AND SYMPTOMS Stress may cause emotional symptoms including, the following:  Anxiety. This is feeling worried, afraid, on edge, overwhelmed, or out of control.  Anger. This is feeling irritated or impatient.  Depression. This is feeling sad, down, helpless, or guilty.  Difficulty focusing, remembering, or making decisions. Stress may cause physical symptoms, including the following:   Aches and pains. These may affect your head, neck, back, stomach, or other areas of your body.  Tight muscles or clenched jaw.  Low energy  or trouble sleeping. Stress may cause unhealthy behaviors, including the following:   Eating to feel better (overeating) or skipping meals.  Sleeping too little, too much, or both.  Working too much or putting off tasks (procrastination).  Smoking, drinking alcohol, or using drugs to feel better. DIAGNOSIS  Stress is diagnosed through an assessment by your health care provider. Your health care provider will ask questions about your symptoms and any stressful life events.Your health care provider will also ask about your medical history and may order blood tests or other tests. Certain medical conditions and medicine can cause physical symptoms similar to stress. Mental illness can cause emotional symptoms and unhealthy behaviors similar to stress. Your health care provider may refer you to a mental health professional for further evaluation.  TREATMENT  Stress management is the recommended treatment for stress.The goals of stress management are reducing stressful life events and coping with stress in healthy ways.  Techniques for reducing stressful life events include the following:  Stress identification. Self-monitor for stress and identify what causes stress for you. These skills may help you to avoid some stressful events.  Time management. Set your priorities, keep a calendar of events, and learn to say "no." These tools can help you avoid making too many commitments. Techniques for coping with stress include the following:  Rethinking the problem. Try to think realistically about stressful events rather than ignoring them or overreacting. Try to find the positives in a stressful situation rather than focusing on the negatives.  Exercise. Physical exercise can  release both physical and emotional tension. The key is to find a form of exercise you enjoy and do it regularly.  Relaxation techniques. These relax the body and mind. Examples include yoga, meditation, tai chi, biofeedback,  deep breathing, progressive muscle relaxation, listening to music, being out in nature, journaling, and other hobbies. Again, the key is to find one or more that you enjoy and can do regularly.  Healthy lifestyle. Eat a balanced diet, get plenty of sleep, and do not smoke. Avoid using alcohol or drugs to relax.  Strong support network. Spend time with family, friends, or other people you enjoy being around.Express your feelings and talk things over with someone you trust. Counseling or talktherapy with a mental health professional may be helpful if you are having difficulty managing stress on your own. Medicine is typically not recommended for the treatment of stress.Talk to your health care provider if you think you need medicine for symptoms of stress. HOME CARE INSTRUCTIONS  Keep all follow-up visits as directed by your health care provider.  Take all medicines as directed by your health care provider. SEEK MEDICAL CARE IF:  Your symptoms get worse or you start having new symptoms.  You feel overwhelmed by your problems and can no longer manage them on your own. SEEK IMMEDIATE MEDICAL CARE IF:  You feel like hurting yourself or someone else.   This information is not intended to replace advice given to you by your health care provider. Make sure you discuss any questions you have with your health care provider.   Document Released: 07/25/2000 Document Revised: 02/19/2014 Document Reviewed: 09/23/2012 Elsevier Interactive Patient Education 2016 [employer].     Adjustment Disorder Adjustment disorder is an unusually severe reaction to a stressful life event, such as the loss of a job or physical illness. The event may be any stressful event other than the loss of a loved one. Adjustment disorder may affect your feelings, your thinking, how you act, or a combination of these. It may interfere with personal relationships or with the way you are at work, school, or home. People with  this disorder are at risk for suicide and substance abuse. They may develop a more serious mental disorder, such as major depressive disorder or post-traumatic stress disorder. SIGNS AND SYMPTOMS  Symptoms may include:  Sadness, depressed mood, or crying spells.  Loss of enjoyment.  Change in appetite or weight.  Sense of loss or hopelessness.  Thoughts of suicide.  Anxiety, worry, or nervousness.  Trouble sleeping.  Avoiding family and friends.  Poor school performance.  Fighting or vandalism.  Reckless driving.  Skipping school.  Poor work Systems analyst.  Ignoring bills. Symptoms of adjustment disorder start within 3 months of the stressful life event. They do not last more than 6 months after the event has ended. DIAGNOSIS  To make a diagnosis, your health care provider will ask about what has happened in your life and how it has affected you. He or she may also ask about your medical history and use of medicines, alcohol, and other substances. Your health care provider may do a physical exam and order lab tests or other studies. You may be referred to a mental health specialist for evaluation. TREATMENT  Treatment options include:  Counseling or talk therapy. Talk therapy is usually provided by mental health specialists.  Medicine. Certain medicines may help with depression, anxiety, and sleep.  Support groups. Support groups offer emotional support, advice, and guidance. They are made up of  people who have had similar experiences. HOME CARE INSTRUCTIONS  Keep all follow-up visits as directed by your health care provider. This is important.  Take medicines only as directed by your health care provider. SEEK MEDICAL CARE IF:  Your symptoms get worse.  SEEK IMMEDIATE MEDICAL CARE IF: You have serious thoughts about hurting yourself or someone else. MAKE SURE YOU:  Understand these instructions.  Will watch your condition.  Will get help right away if you are  not doing well or get worse.   This information is not intended to replace advice given to you by your health care provider. Make sure you discuss any questions you have with your health care provider.   Document Released: 10/03/2005 Document Revised: 02/19/2014 Document Reviewed: 06/23/2013 Elsevier Interactive Patient Education [employer].

## 2015-11-29 ENCOUNTER — Ambulatory Visit (HOSPITAL_COMMUNITY): Payer: Self-pay | Admitting: Psychiatry

## 2015-12-20 IMAGING — CR DG FOOT COMPLETE 3+V*L*
3 series · 3 of 3 positions shown · non-contrast
Comparison: None.

CLINICAL DATA: Pt reports that she was at the gym and dropped a 45
pound weight on her left foot. Pt has mild swelling/pain to the
medial aspect of left foot near her big toe.

EXAM:
LEFT FOOT - COMPLETE 3+ VIEW

[foot ap]
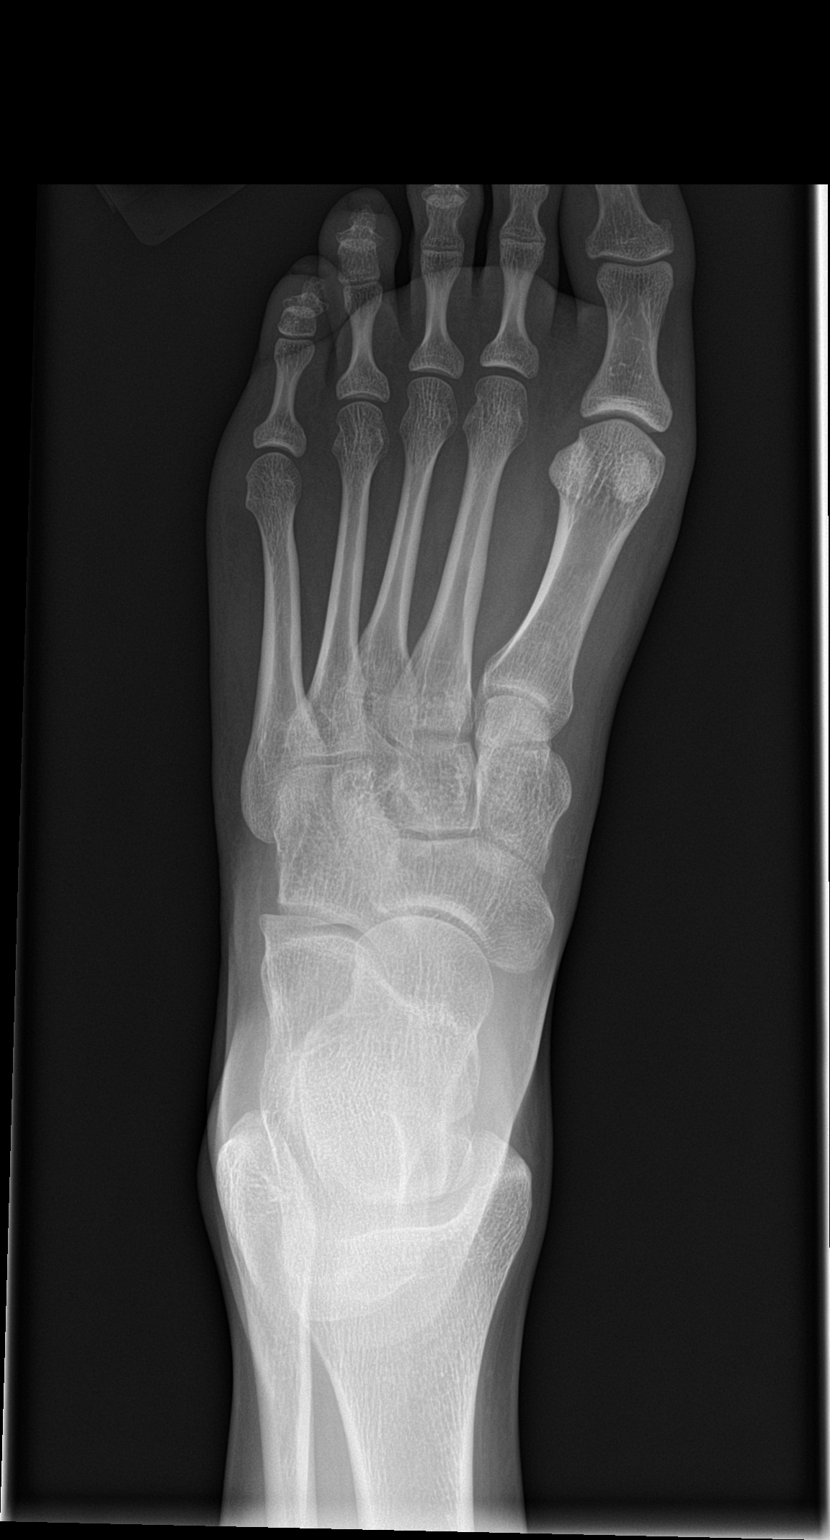

[foot obl]
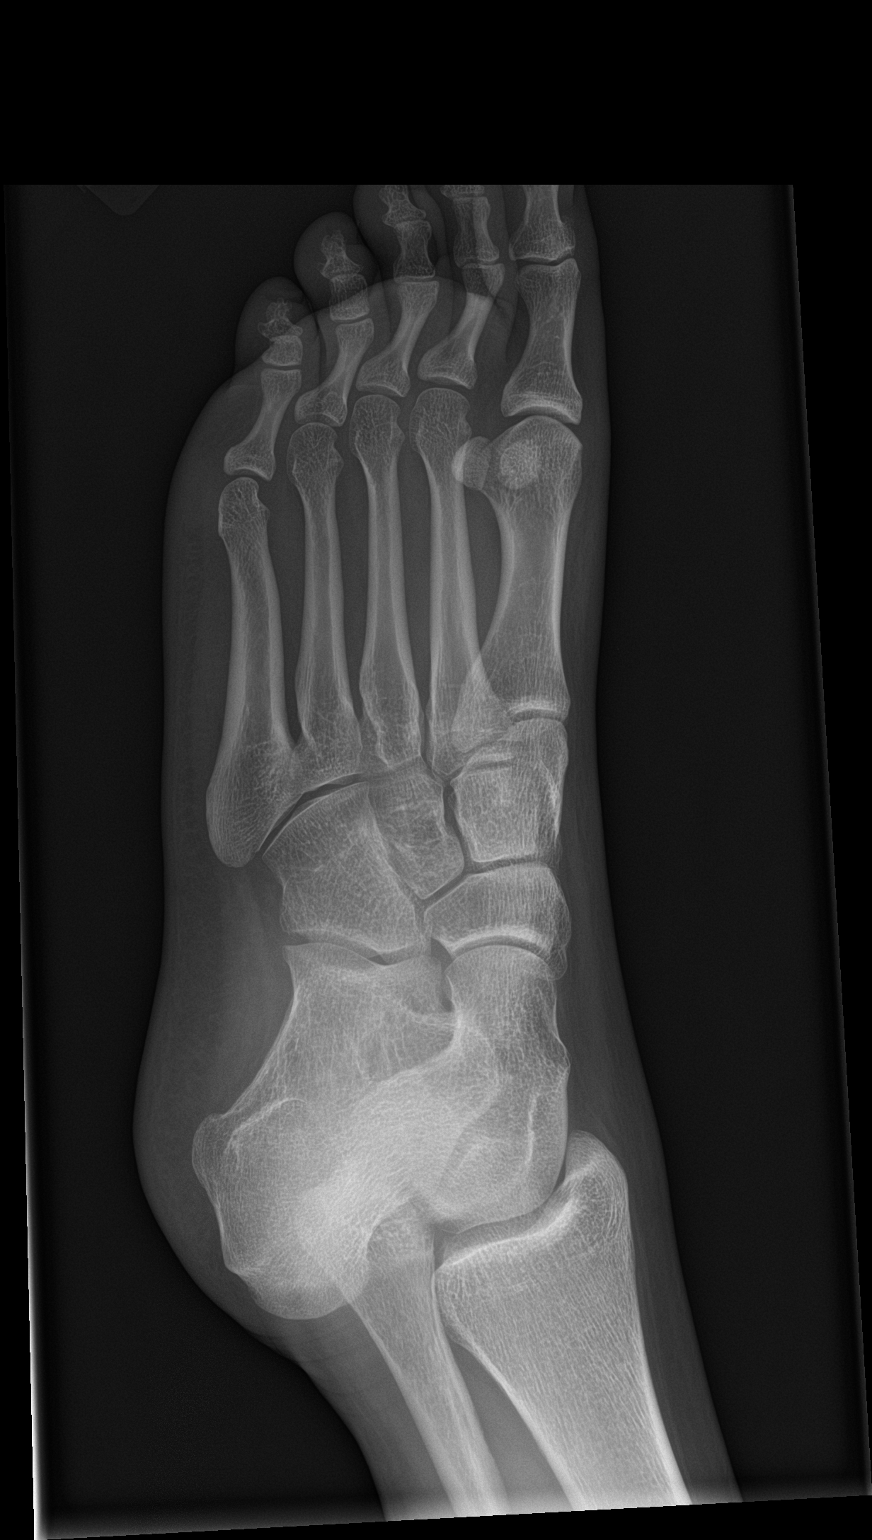

[foot lat]
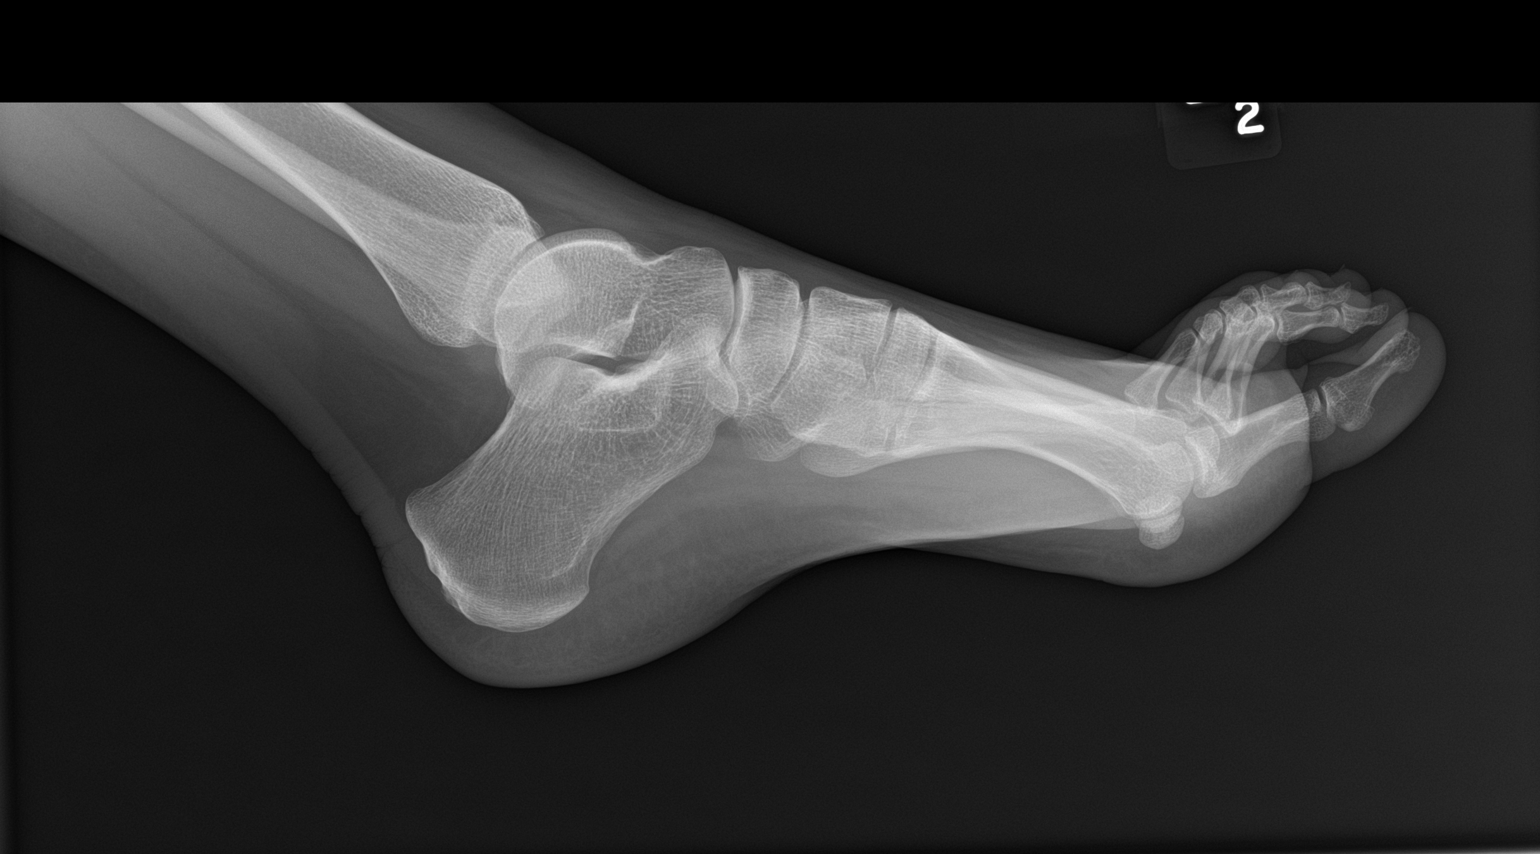

[3 of 3 positions shown; findings below may reference images not displayed]

FINDINGS: There is no evidence of fracture or dislocation. There is no
evidence of arthropathy or other focal bone abnormality. Soft
tissues are unremarkable.
IMPRESSION: Negative.

## 2015-12-27 NOTE — Progress Notes (Deleted)
Psychiatric Initial Adult Assessment   Patient Identification: Angel Oneal DoctorKatelyn M Plessinger MRN:  [id number] Date of Evaluation:  12/27/2015 Referral Source: *** Chief Complaint:   Visit Diagnosis: No diagnosis found.  History of Present Illness:   Angel Oneal DoctorKatelyn M Brickner is a 20 year old female with history of depression, eating disorder, referred for anxiety.   Associated Signs/Symptoms: Depression Symptoms:  {DEPRESSION SYMPTOMS:20000} (Hypo) Manic Symptoms:  {BHH MANIC SYMPTOMS:22872} Anxiety Symptoms:  {BHH ANXIETY SYMPTOMS:22873} Psychotic Symptoms:  {BHH PSYCHOTIC SYMPTOMS:22874} PTSD Symptoms: {BHH PTSD SYMPTOMS:22875}  Past Psychiatric History: ***  Previous Psychotropic Medications: {YES/NO:21197}  Substance Abuse History in the last 12 months:  {yes no:314532}  Consequences of Substance Abuse: {BHH CONSEQUENCES OF SUBSTANCE ABUSE:22880}  Past Medical History:  Past Medical History:  Diagnosis Date  . Anxiety   . Asthma     Past Surgical History:  Procedure Laterality Date  . APPENDECTOMY  2007    Family Psychiatric History: ***  Family History:  Family History  Problem Relation Age of Onset  . Suicidality Cousin   . Depression Cousin   . Bipolar disorder Cousin   . Diabetes Mother   . Cancer Mother     breast  . Heart failure Father   . Depression Father   . Healthy Sister   . Alzheimer's disease Maternal Grandmother   . Heart disease Maternal Grandfather   . Heart disease Paternal Grandmother   . Dementia Paternal Grandfather     Social History:   Social History   Social History  . Marital status: Single    Spouse name: N/A  . Number of children: N/A  . Years of education: N/A   Social History Main Topics  . Smoking status: Former Smoker    Packs/day: 0.25    Years: 1.00    Types: Cigarettes    Quit date: 11/13/2014  . Smokeless tobacco: Never Used  . Alcohol use Not on file  . Drug use:     Types: Marijuana  . Sexual activity: Yes    Birth  control/ protection: Implant   Other Topics Concern  . Not on file   Social History Narrative  . No narrative on file    Additional Social History: ***  Allergies:  No Known Allergies  Metabolic Disorder Labs: No results found for: HGBA1C, MPG No results found for: PROLACTIN No results found for: CHOL, TRIG, HDL, CHOLHDL, VLDL, LDLCALC   Current Medications: Current Outpatient Prescriptions  Medication Sig Dispense Refill  . albuterol (PROVENTIL HFA;VENTOLIN HFA) 108 (90 Base) MCG/ACT inhaler Inhale 2 puffs into the lungs every 6 (six) hours as needed. 1 Inhaler 0  . buPROPion (WELLBUTRIN XL) 150 MG 24 hr tablet Take 1 tablet (150 mg total) by mouth every morning. 30 tablet 2  . etonogestrel-ethinyl estradiol (NUVARING) 0.12-0.015 MG/24HR vaginal ring Place 1 each vaginally every 28 (twenty-eight) days. Insert vaginally and leave in place for 3 consecutive weeks, then remove for 1 week.    Marland Kitchen. ibuprofen (ADVIL,MOTRIN) 800 MG tablet Take 1 tablet (800 mg total) by mouth 3 (three) times daily. 21 tablet 0   No current facility-administered medications for this visit.     Neurologic: Headache: {BHH YES OR NO:22294} Seizure: {BHH YES OR NO:22294} Paresthesias:{BHH YES OR WU:98119}O:22294}  Musculoskeletal: Strength & Muscle Tone: {desc; muscle tone:32375} Gait & Station: {PE GAIT ED JYNW:29562}ATL:22525} Patient leans: {Patient Leans:21022755}  Psychiatric Specialty Exam: ROS  There were no vitals taken for this visit.There is no height or weight on file to calculate BMI.  General Appearance: {Appearance:22683}  Eye Contact:  {BHH EYE CONTACT:22684}  Speech:  {Speech:22685}  Volume:  {Volume (PAA):22686}  Mood:  {BHH MOOD:22306}  Affect:  {Affect (PAA):22687}  Thought Process:  {Thought Process (PAA):22688}  Orientation:  {BHH ORIENTATION (PAA):22689}  Thought Content:  {Thought Content:22690}  Suicidal Thoughts:  {ST/HT (PAA):22692}  Homicidal Thoughts:  {ST/HT (PAA):22692}  Memory:   {BHH MEMORY:22881}  Judgement:  {Judgement (PAA):22694}  Insight:  {Insight (PAA):22695}  Psychomotor Activity:  {Psychomotor (PAA):22696}  Concentration:  {Concentration:21399}  Recall:  {BHH GOOD/FAIR/POOR:22877}  Fund of Knowledge:{BHH GOOD/FAIR/POOR:22877}  Language: {BHH GOOD/FAIR/POOR:22877}  Akathisia:  {BHH YES OR NO:22294}  Handed:  {Handed:22697}  AIMS (if indicated):  ***  Assets:  {Assets (PAA):22698}  ADL's:  {BHH ZOX'W:96045}ADL'S:22290}  Cognition: {chl bhh cognition:304700322}  Sleep:  ***    Treatment Plan Summary: {CHL AMB BH MD TX WUJW:1191478295}[phone]}   Neysa Hottereina Katrine Radich, MD 11/14/201710:30 AM

## 2015-12-28 ENCOUNTER — Ambulatory Visit (HOSPITAL_COMMUNITY): Payer: Self-pay | Admitting: Psychiatry

## 2019-05-08 ENCOUNTER — Ambulatory Visit: Payer: 59 | Attending: Internal Medicine

## 2019-05-08 DIAGNOSIS — Z23 Encounter for immunization: Secondary | ICD-10-CM

## 2019-05-08 NOTE — Progress Notes (Signed)
   Covid-19 Vaccination Clinic  Name:  Angel Oneal    MRN: [id number] DOB: Apr 10, 1995  05/08/2019  [name] was observed post Covid-19 immunization for 15 minutes without incident. She was provided with Vaccine Information Sheet and instruction to access the V-Safe system.   [name] was instructed to call 911 with any severe reactions post vaccine: Marland Kitchen Difficulty breathing  . Swelling of face and throat  . A fast heartbeat  . A bad rash all over body  . Dizziness and weakness   Immunizations Administered    Name Date Dose VIS Date Route   Pfizer COVID-19 Vaccine 05/08/2019  2:29 PM 0.3 mL 01/23/2019 Intramuscular   Manufacturer: [employer], [employer]   Lot: [id number]   NDC: [address]-9

## 2019-06-02 ENCOUNTER — Ambulatory Visit: Payer: 59 | Attending: Internal Medicine

## 2019-06-02 DIAGNOSIS — Z23 Encounter for immunization: Secondary | ICD-10-CM

## 2019-06-02 NOTE — Progress Notes (Signed)
   Covid-19 Vaccination Clinic  Name:  Angel Oneal    MRN: [id number] DOB: [birth year]  06/02/2019  [name] was observed post Covid-19 immunization for 15 minutes without incident. She was provided with Vaccine Information Sheet and instruction to access the V-Safe system.   [name] was instructed to call 911 with any severe reactions post vaccine: Marland Kitchen Difficulty breathing  . Swelling of face and throat  . A fast heartbeat  . A bad rash all over body  . Dizziness and weakness   Immunizations Administered    Name Date Dose VIS Date Route   Pfizer COVID-19 Vaccine 06/02/2019  2:56 PM 0.3 mL 04/08/2018 Intramuscular   Manufacturer: [employer], [employer]   Lot: [id number]   NDC: [address]-4
